# Patient Record
Sex: Male | Born: 1975 | Race: White | Hispanic: No | Marital: Married | Smoking: Never smoker
Health system: Southern US, Community
[De-identification: ages and names within clinical notes are randomized; demographics above are authoritative.]

## PROBLEM LIST (undated history)

## (undated) DIAGNOSIS — M109 Gout, unspecified: Secondary | ICD-10-CM

---

## 2003-10-09 ENCOUNTER — Ambulatory Visit: Payer: Self-pay | Admitting: Oncology

## 2003-12-06 ENCOUNTER — Ambulatory Visit: Payer: Self-pay | Admitting: Oncology

## 2003-12-09 ENCOUNTER — Ambulatory Visit: Payer: Self-pay | Admitting: Oncology

## 2004-01-09 ENCOUNTER — Ambulatory Visit: Payer: Self-pay | Admitting: Oncology

## 2004-06-30 ENCOUNTER — Ambulatory Visit: Payer: Self-pay | Admitting: Oncology

## 2004-07-04 ENCOUNTER — Ambulatory Visit: Payer: Self-pay | Admitting: Oncology

## 2004-07-08 ENCOUNTER — Ambulatory Visit: Payer: Self-pay | Admitting: Oncology

## 2004-10-04 ENCOUNTER — Ambulatory Visit: Payer: Self-pay | Admitting: Oncology

## 2004-10-08 ENCOUNTER — Ambulatory Visit: Payer: Self-pay | Admitting: Oncology

## 2005-01-04 ENCOUNTER — Ambulatory Visit: Payer: Self-pay | Admitting: Oncology

## 2005-01-10 ENCOUNTER — Ambulatory Visit: Payer: Self-pay | Admitting: Oncology

## 2005-02-08 ENCOUNTER — Ambulatory Visit: Payer: Self-pay | Admitting: Oncology

## 2005-05-17 ENCOUNTER — Ambulatory Visit: Payer: Self-pay | Admitting: Oncology

## 2005-06-08 ENCOUNTER — Ambulatory Visit: Payer: Self-pay | Admitting: Oncology

## 2005-07-08 ENCOUNTER — Ambulatory Visit: Payer: Self-pay | Admitting: Oncology

## 2005-08-09 ENCOUNTER — Ambulatory Visit: Payer: Self-pay | Admitting: Oncology

## 2005-09-08 ENCOUNTER — Ambulatory Visit: Payer: Self-pay | Admitting: Oncology

## 2005-12-21 ENCOUNTER — Ambulatory Visit: Payer: Self-pay | Admitting: Internal Medicine

## 2006-01-08 ENCOUNTER — Ambulatory Visit: Payer: Self-pay | Admitting: Internal Medicine

## 2006-04-09 ENCOUNTER — Ambulatory Visit: Payer: Self-pay | Admitting: Internal Medicine

## 2006-08-02 ENCOUNTER — Ambulatory Visit: Payer: Self-pay

## 2006-10-25 ENCOUNTER — Ambulatory Visit: Payer: Self-pay | Admitting: Urology

## 2007-03-09 ENCOUNTER — Ambulatory Visit: Payer: Self-pay | Admitting: Oncology

## 2008-12-21 IMAGING — CT CT NECK WITH CONTRAST
2 series · 10 of 14 positions shown, 12 images · non-contrast
Comparison: none

REASON FOR EXAM: carotid tenderness
COMMENTS:

[Series 2: soft tissue · axial · 0.53mm/px · z∈[-71,+202]mm · 8 of 117 slices shown, 10 images]
[im 13/117  soft-tissue]
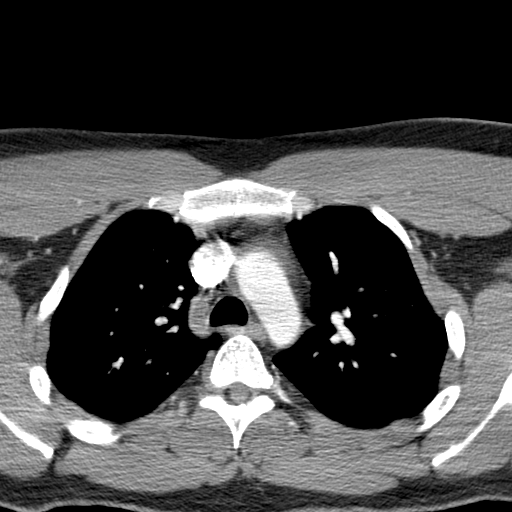
[im 13/117  bone]
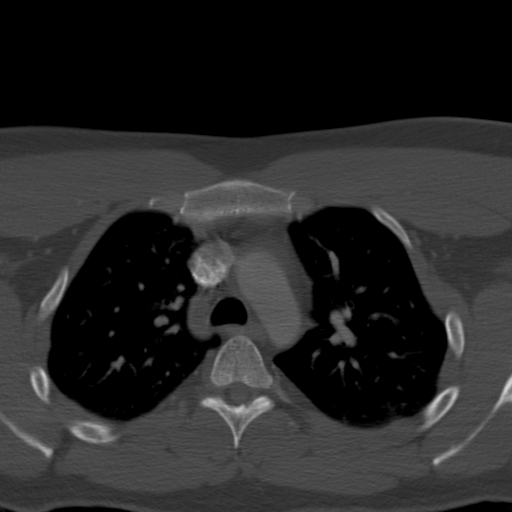
[im 26/117  bone]
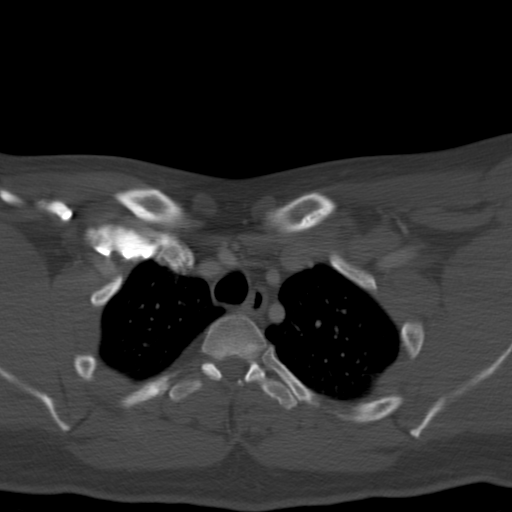
[im 39/117  bone]
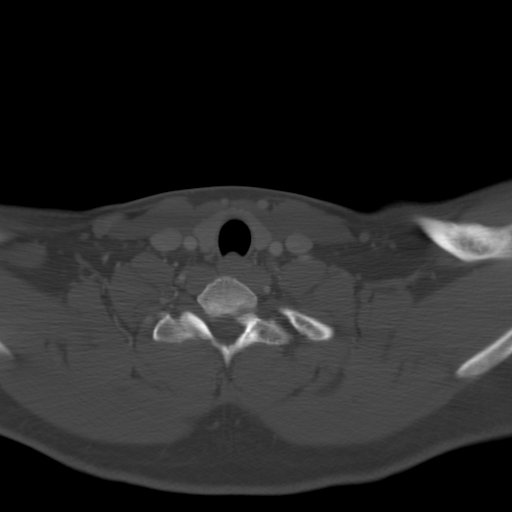
[im 52/117  bone]
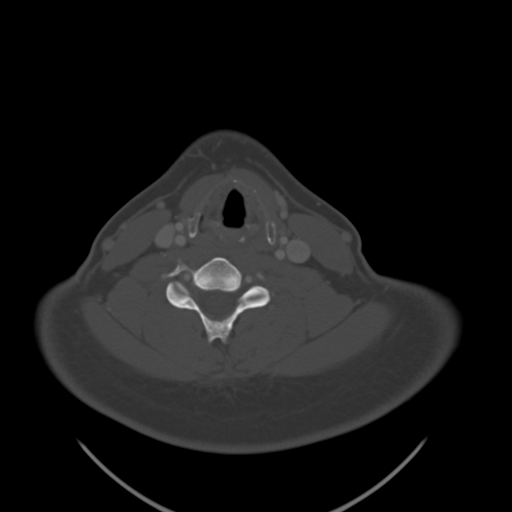
[im 65/117  soft-tissue]
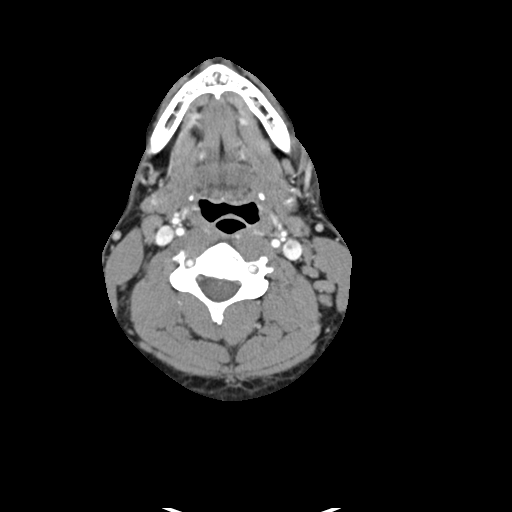
[im 65/117  bone]
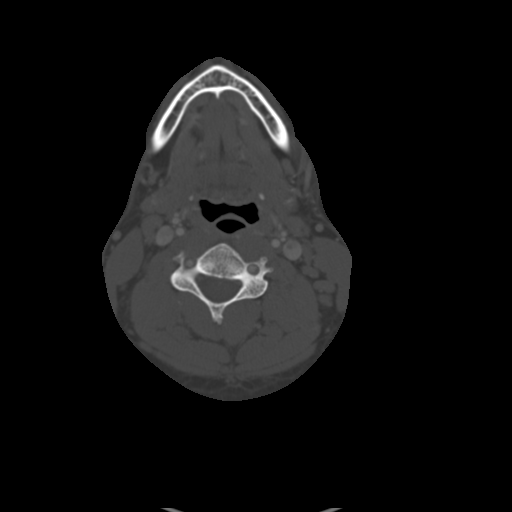
[im 78/117  bone]
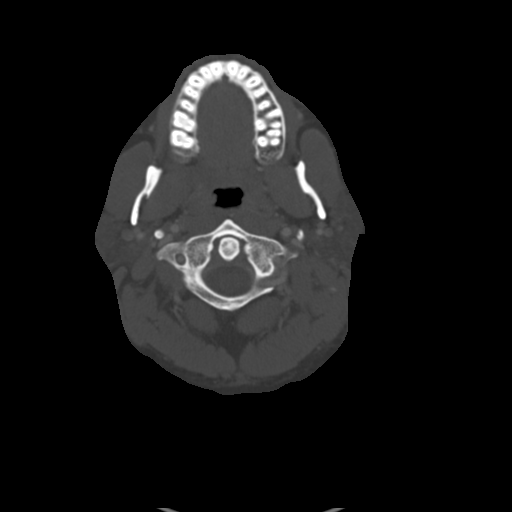
[im 91/117  bone]
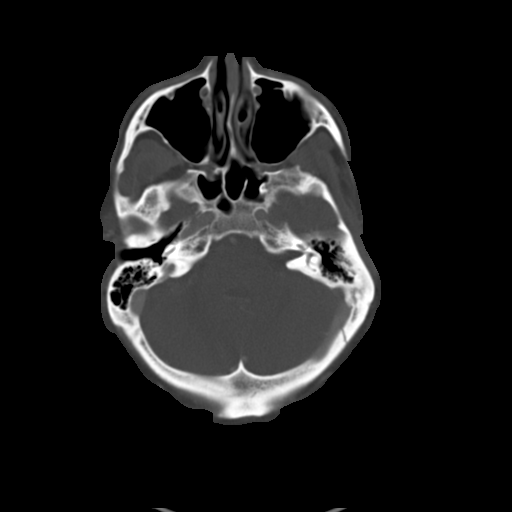
[im 104/117  bone]
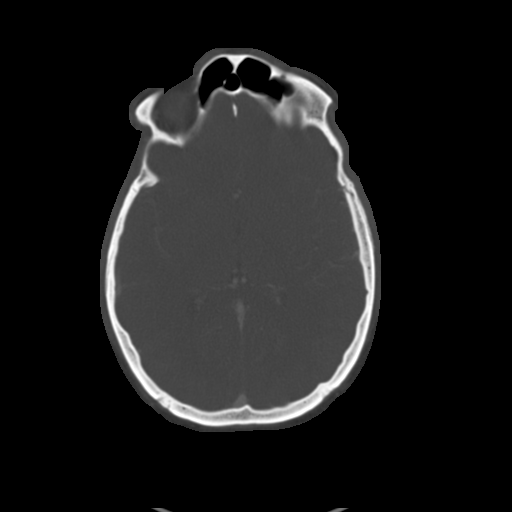

[Series 4: lung windows · axial · 0.66mm/px · z∈[-68,-29]mm · 2 of 40 slices shown]
[im 14/40  bone]
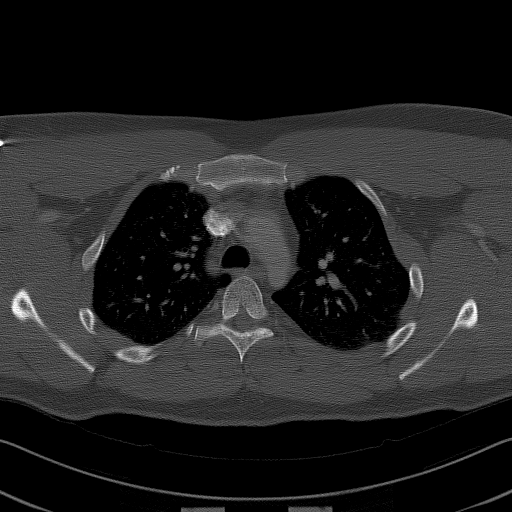
[im 27/40  bone]
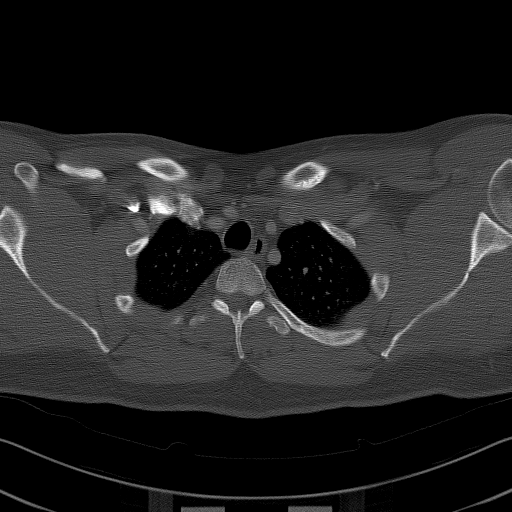

[10 of 14 positions shown; findings below may reference images not displayed]

PROCEDURE:     CT  - CT NECK WITH CONTRAST  - August 02, 2006  [DATE]

RESULT:     The patient received 70 ml of Isovue 370 for this study.

There are mildly enlarged lymph nodes bilaterally in the anterior and
posterior cervical regions. The largest nodes are seen anterior to the
jugular veins bilaterally. These measure approximately 1.5 cm in diameter.
Multiple centimeter sized subcentimeter nodes are noted both anterior to and
deep to the sternocleidomastoid muscles and adjacent to the carotid vessels.
The parotid glands are normal in density and contour. The submandibular
glands also are normal in density and contour. A few submandibular lymph
nodes are noted. The observed portions of the paranasal sinuses are clear.
The nasopharyngeal structures and oropharyngeal structures are felt to be
within the limits of normal. Normal prominence of the tonsils and adenoidal
regions is seen with no evidence of a hypodense or hyperdense mass.

The laryngeal structures are normal in appearance. The thyroid lobes are
normal in density and symmetric in size. I do not see lymphadenopathy in the
visualized portions of the supraclavicular regions. The carotid and jugular
vessels are normal in appearance. The observed portions of the upper lobes
exhibit no acute abnormality.
IMPRESSION: 1. There are borderline to mildly enlarged lymph nodes bilaterally in the
anterior cervical regions with a few in the posterior cervical and
submandibular regions.
2. I do not see pathologic masses involving either the carotid or
submandibular glands. There may be a mildly enlarged lymph node within the
substance of the upper portion of the parotid gland on the RIGHT.
3.There is no evidence of abnormality involving the oropharyngeal or
nasopharyngeal structures. There is mild prominence of the tonsillar and
adenoidal regions but these are symmetric.
4.There is no abnormality of the thyroid gland or of the laryngeal
structures.
5.While the lymph nodes in the neck are not bulky they are quite numerous.
If the etiology for this finding remains unclear, CT scanning of the
abdomen, and pelvis and possibly PET imaging may be useful.

## 2009-03-15 IMAGING — CR DG ABDOMEN 1V
1 series · 2 of 2 positions shown · non-contrast
Comparison: none

REASON FOR EXAM: Nephrolithiasis
COMMENTS:

[Series 1: view not recorded · 0.17mm/px · 2 of 2 slices shown]
[im 1/2]
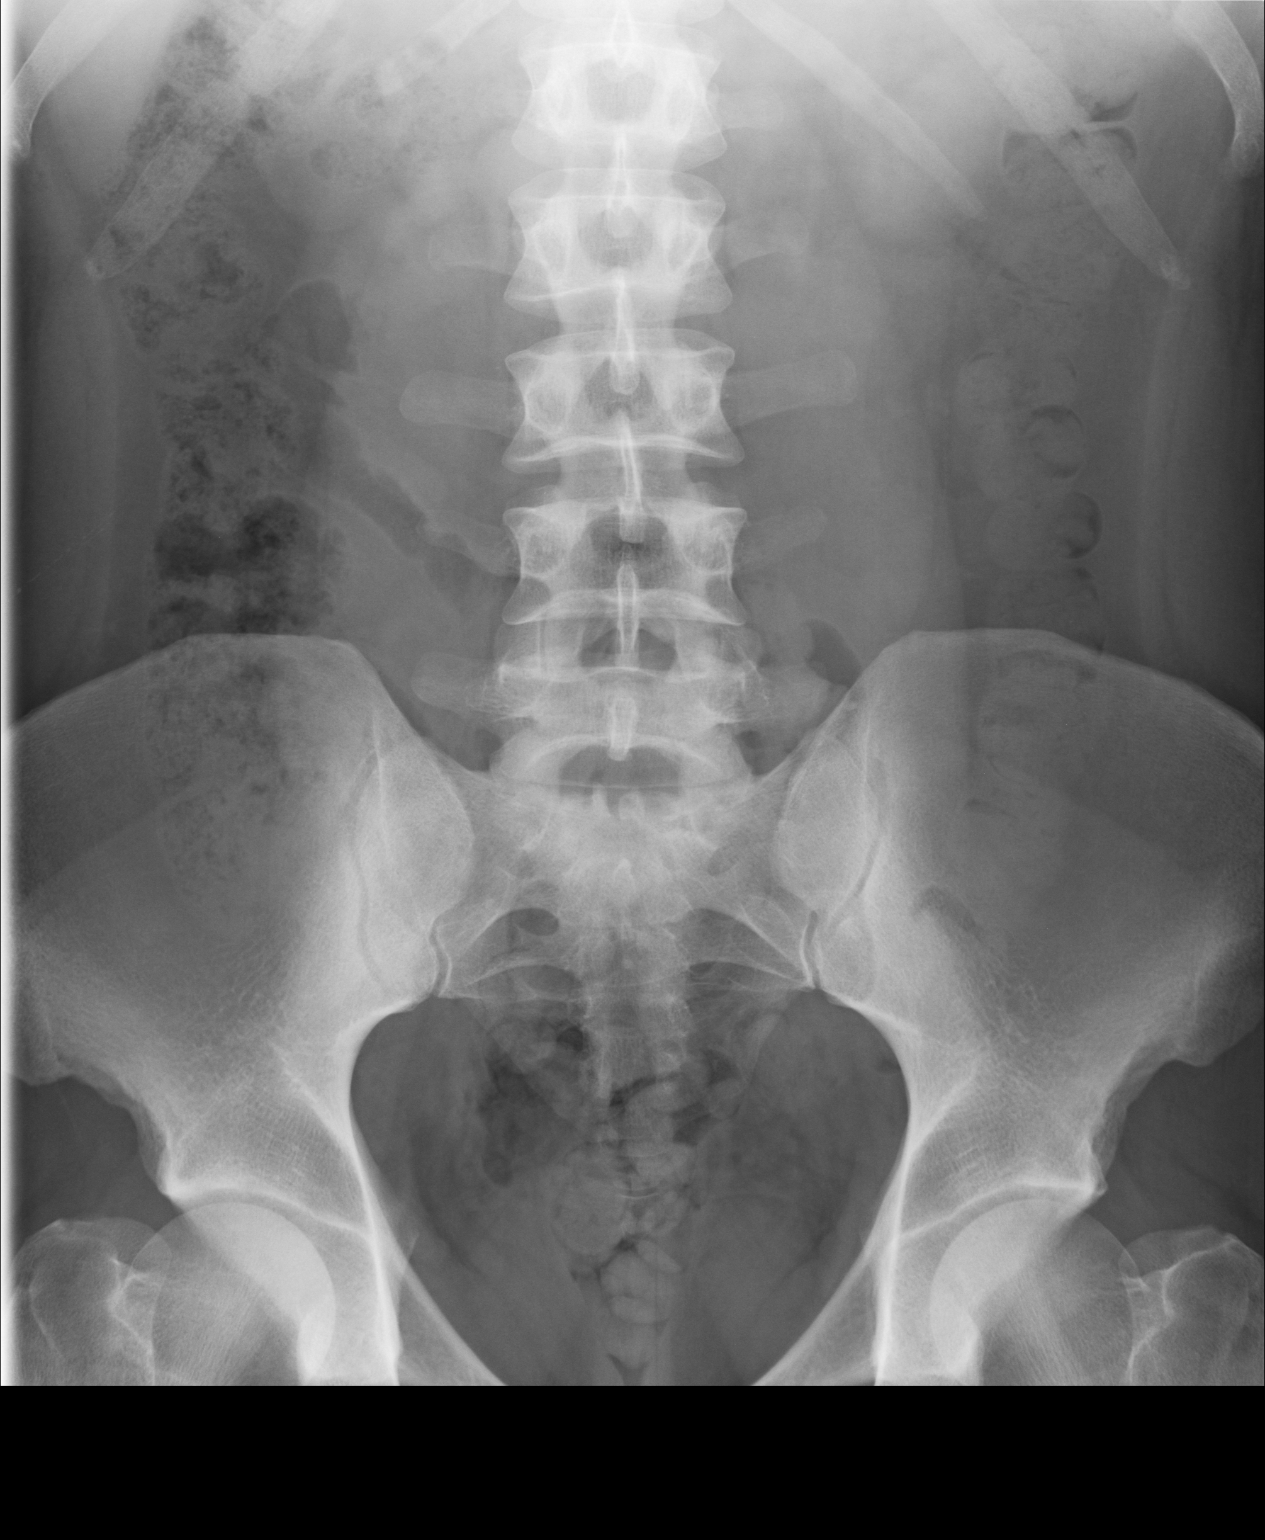
[im 2/2]
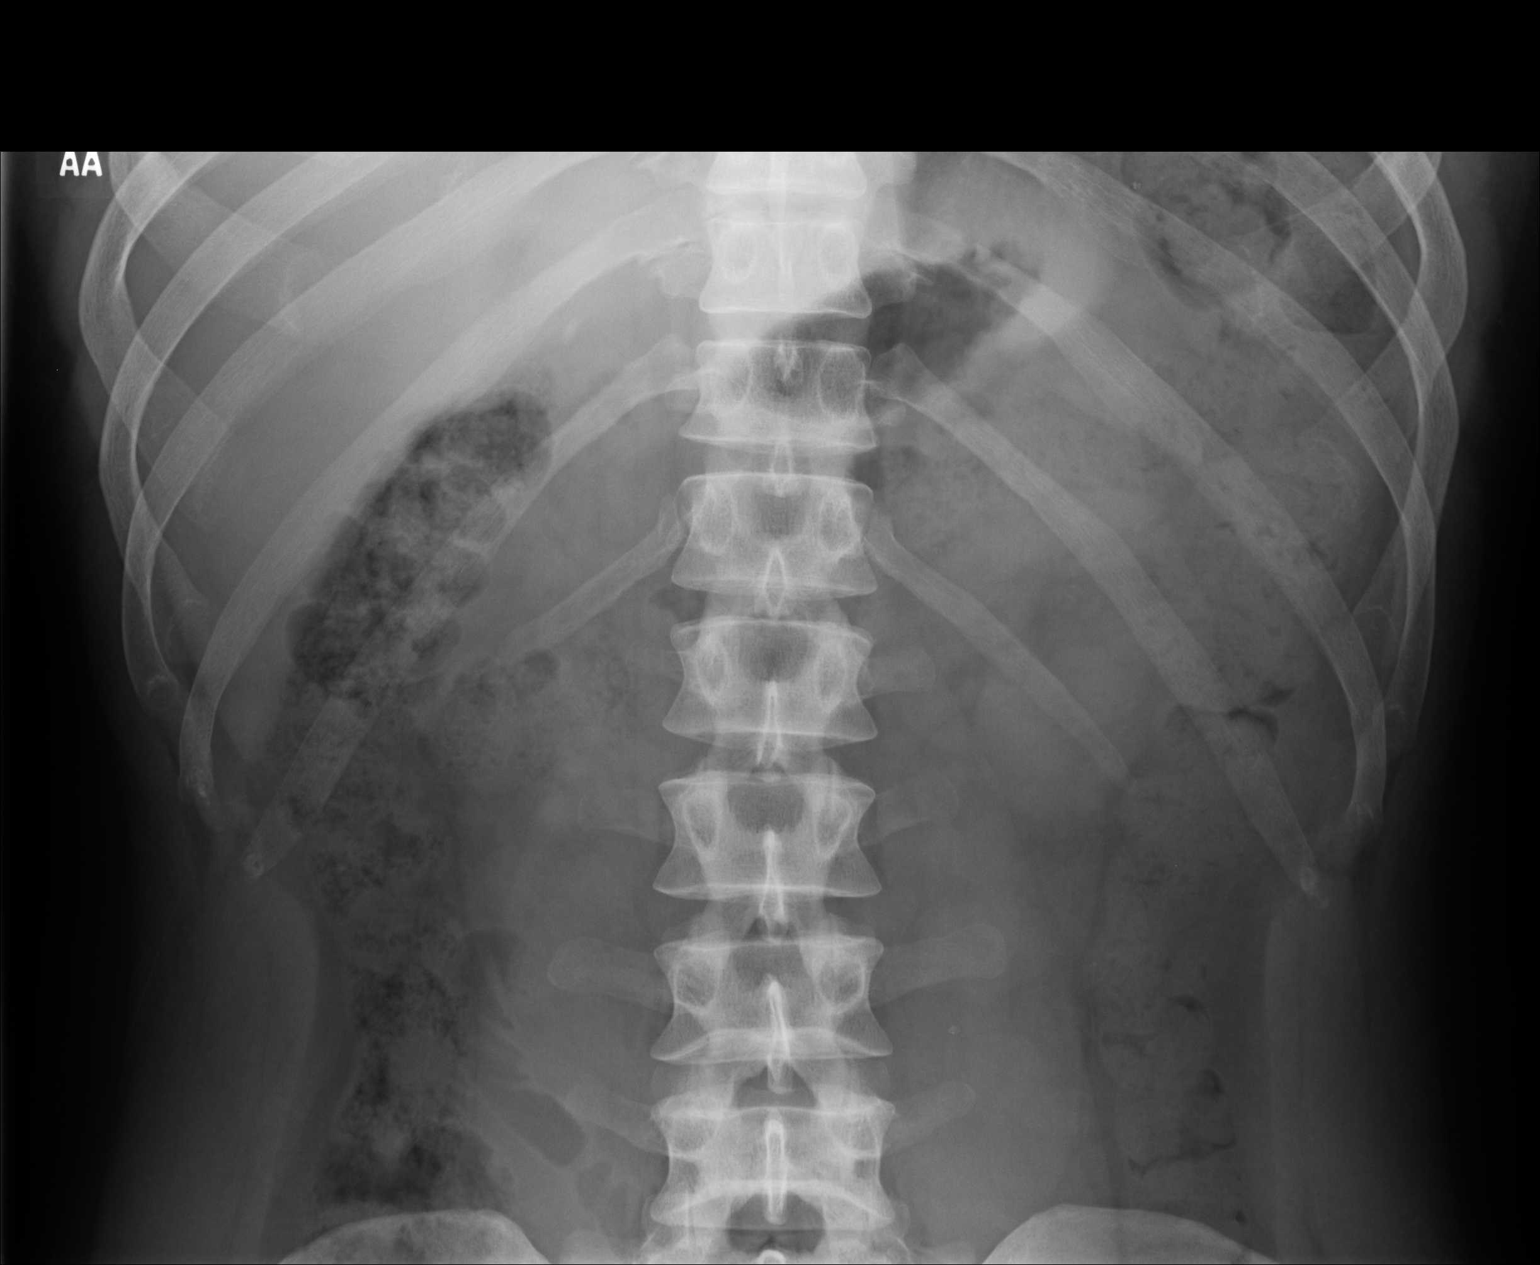

[2 of 2 positions shown; findings below may reference images not displayed]

PROCEDURE:     DXR - DXR KIDNEY URETER BLADDER  - October 25, 2006  [DATE]

RESULT:     Images of the abdomen demonstrate an unremarkable bowel gas
pattern. There is a small, calcific density seen on the LEFT between the L3
and L4 transverse processes on the image of the upper abdomen to include the
kidneys which is not definitely evident on the larger image. This could be
artifact or the different angle of the projection could have obscured this
over a transverse process. No definite stones are seen over the kidneys or
elsewhere.
IMPRESSION: Please see above.

## 2009-03-20 ENCOUNTER — Emergency Department (HOSPITAL_COMMUNITY): Admission: EM | Admit: 2009-03-20 | Discharge: 2009-03-20 | Payer: Self-pay | Admitting: Emergency Medicine

## 2014-09-05 ENCOUNTER — Ambulatory Visit
Admission: EM | Admit: 2014-09-05 | Discharge: 2014-09-05 | Disposition: A | Discharge status: Home / Self Care | Payer: 59 | Attending: Family Medicine | Admitting: Family Medicine

## 2014-09-05 DIAGNOSIS — B09 Unspecified viral infection characterized by skin and mucous membrane lesions: Secondary | ICD-10-CM

## 2014-09-05 HISTORY — DX: Gout, unspecified: M10.9

## 2014-09-05 NOTE — Discharge Instructions (Signed)

## 2014-10-05 NOTE — ED Provider Notes (Signed)
CSN: [id number]     Arrival date & time 09/05/14  1048 History   First MD Initiated Contact with Patient 09/05/14 1130     Chief Complaint  Patient presents with  . Rash    Pt with rash to hands and wrists starting yesterday. Pain to both hands, and feels light.    (Consider location/radiation/quality/duration/timing/severity/associated sxs/prior Treatment) HPI Comments: 39 yo male with a 2 days h/o rash on hand and wrists. Slightly discomfort. Recent URI. Denies any fevers, chills, chest pains or shortness of breath.   Patient is a 39 y.o. male presenting with rash. The history is provided by the patient.  Rash   Past Medical History  Diagnosis Date  . Gout    History reviewed. No pertinent past surgical history. History reviewed. No pertinent family history. Social History  Substance Use Topics  . Smoking status: Never Smoker   . Smokeless tobacco: Current User     Comment: chews tobacco  . Alcohol Use: Yes     Comment: social    Review of Systems  Skin: Positive for rash.    Allergies  Review of patient's allergies indicates no known allergies.  Home Medications   Prior to Admission medications   Not on File   Meds Ordered and Administered this Visit  Medications - No data to display  BP 158/91 mmHg  Pulse 71  Temp(Src) 98.1 F (36.7 C) (Oral)  Resp 18  Ht  (1.803 m)  Wt 258 lb (117.028 kg)  BMI 36.00 kg/m2  SpO2 97% No data found.   Physical Exam  Constitutional: He appears well-developed and well-nourished. No distress.  HENT:  Head: Normocephalic and atraumatic.  Right Ear: Tympanic membrane, external ear and ear canal normal.  Left Ear: Tympanic membrane, external ear and ear canal normal.  Nose: Rhinorrhea present.  Mouth/Throat: Uvula is midline, oropharynx is clear and moist and mucous membranes are normal. No oropharyngeal exudate or tonsillar abscesses.  Eyes: Conjunctivae and EOM are normal. Pupils are equal, round, and reactive to  light. Right eye exhibits no discharge. Left eye exhibits no discharge. No scleral icterus.  Neck: Normal range of motion. Neck supple. No tracheal deviation present. No thyromegaly present.  Cardiovascular: Normal rate, regular rhythm and normal heart sounds.   Pulmonary/Chest: Effort normal and breath sounds normal. No stridor. No respiratory distress. He has no wheezes. He has no rales. He exhibits no tenderness.  Lymphadenopathy:    He has no cervical adenopathy.  Neurological: He is alert.  Skin: Skin is warm and dry. Rash noted. He is not diaphoretic.  Pinpoint erythematous papules on dorsum of hand, wrists and forearms  Nursing note and vitals reviewed.   ED Course  Procedures (including critical care time)  Labs Review Labs Reviewed - No data to display  Imaging Review No results found.   Visual Acuity Review  Right Eye Distance:   Left Eye Distance:   Bilateral Distance:    Right Eye Near:   Left Eye Near:    Bilateral Near:         MDM   1. Viral rash    There are no discharge medications for this patient. Plan: 1. diagnosis reviewed with patient 2. rx as per orders; risks, benefits, potential side effects reviewed with patient 3. Recommend supportive treatment with rest, increased fluids 4. F/u prn if symptoms worsen or don't improve    Payton Mccallum, MD 10/05/14 1208
